# Patient Record
Sex: Male | Born: 1990 | Race: White | Hispanic: No | Marital: Single | Smoking: Current every day smoker
Health system: Southern US, Community
[De-identification: ages and names within clinical notes are randomized; demographics above are authoritative.]

## PROBLEM LIST (undated history)

## (undated) DIAGNOSIS — Z91010 Allergy to peanuts: Secondary | ICD-10-CM

---
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
## 2012-10-07 IMAGING — CR DG SHOULDER 1V*L*
2 series · 2 of 2 positions shown · non-contrast
Comparison: 11/07/2011 at [DATE] p.m.

CLINICAL DATA: Baseball injury of the shoulder.  Post reduction
views.

PORTABLE LEFT SHOULDER - 2+ VIEW

[view not recorded (1 of 2)]
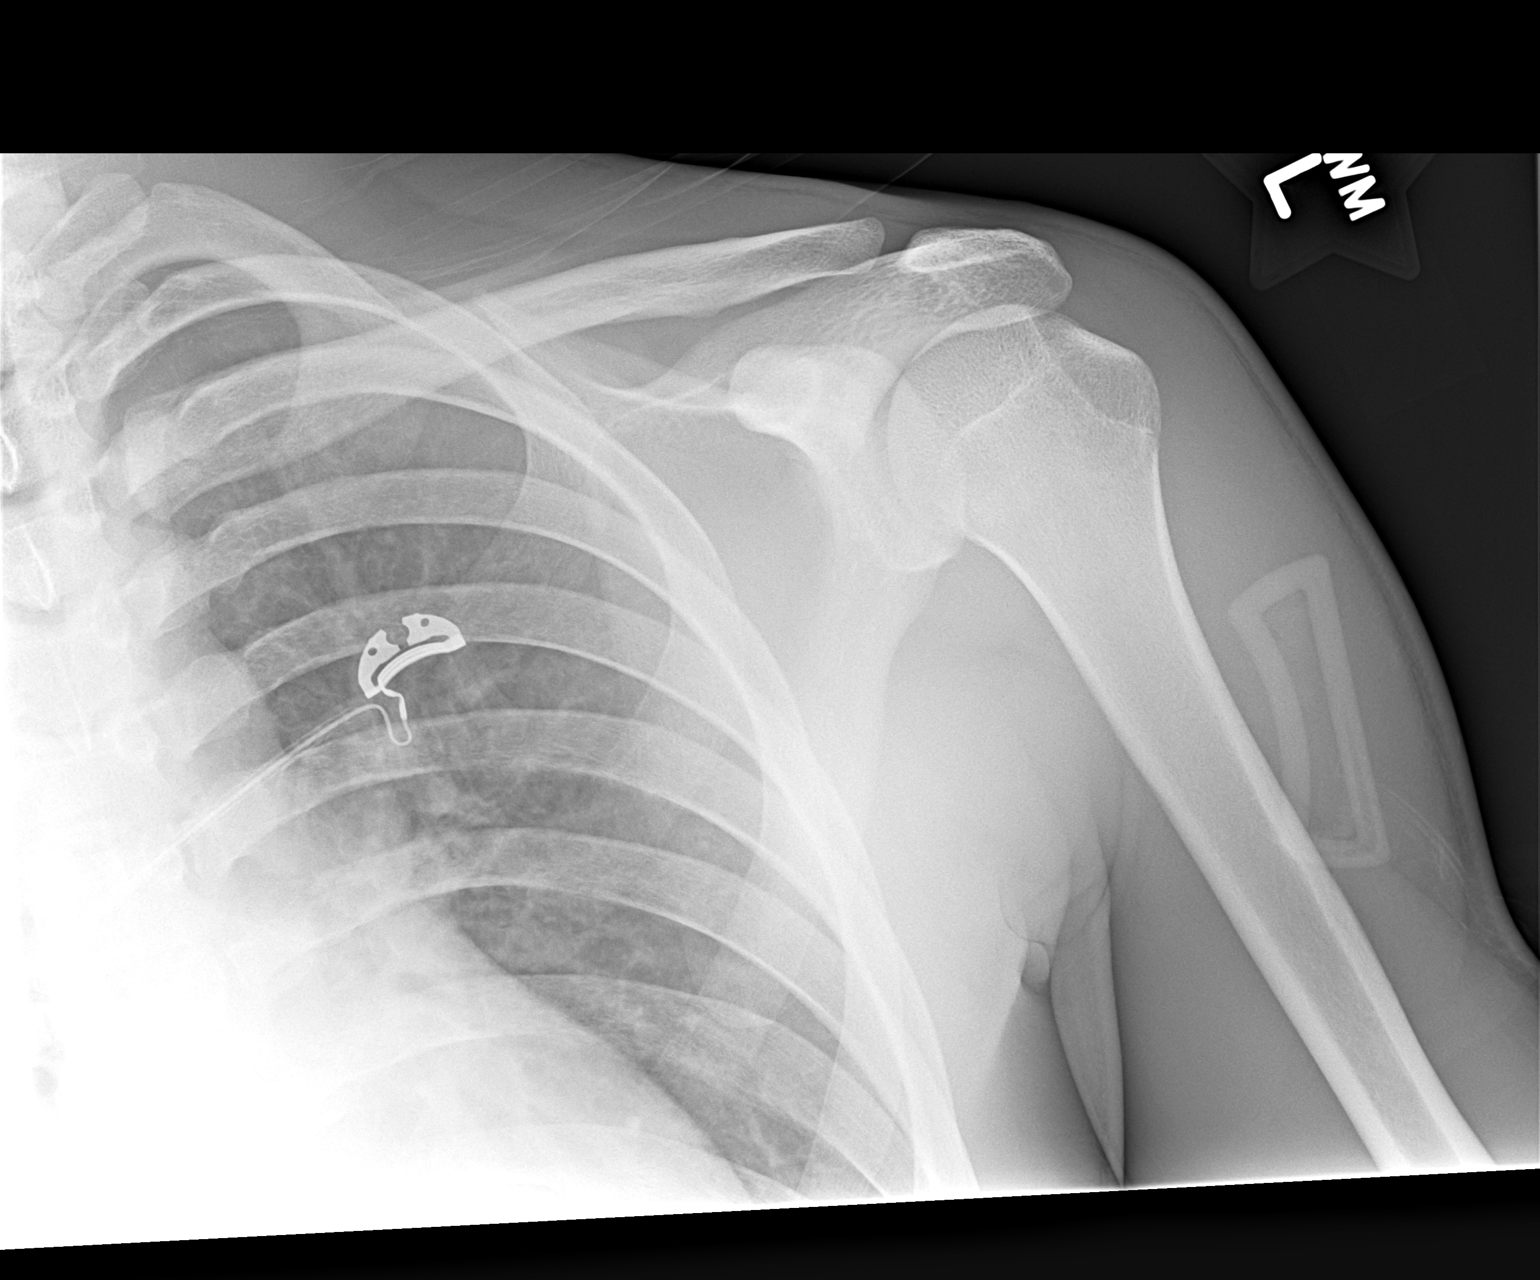

[view not recorded (2 of 2)]
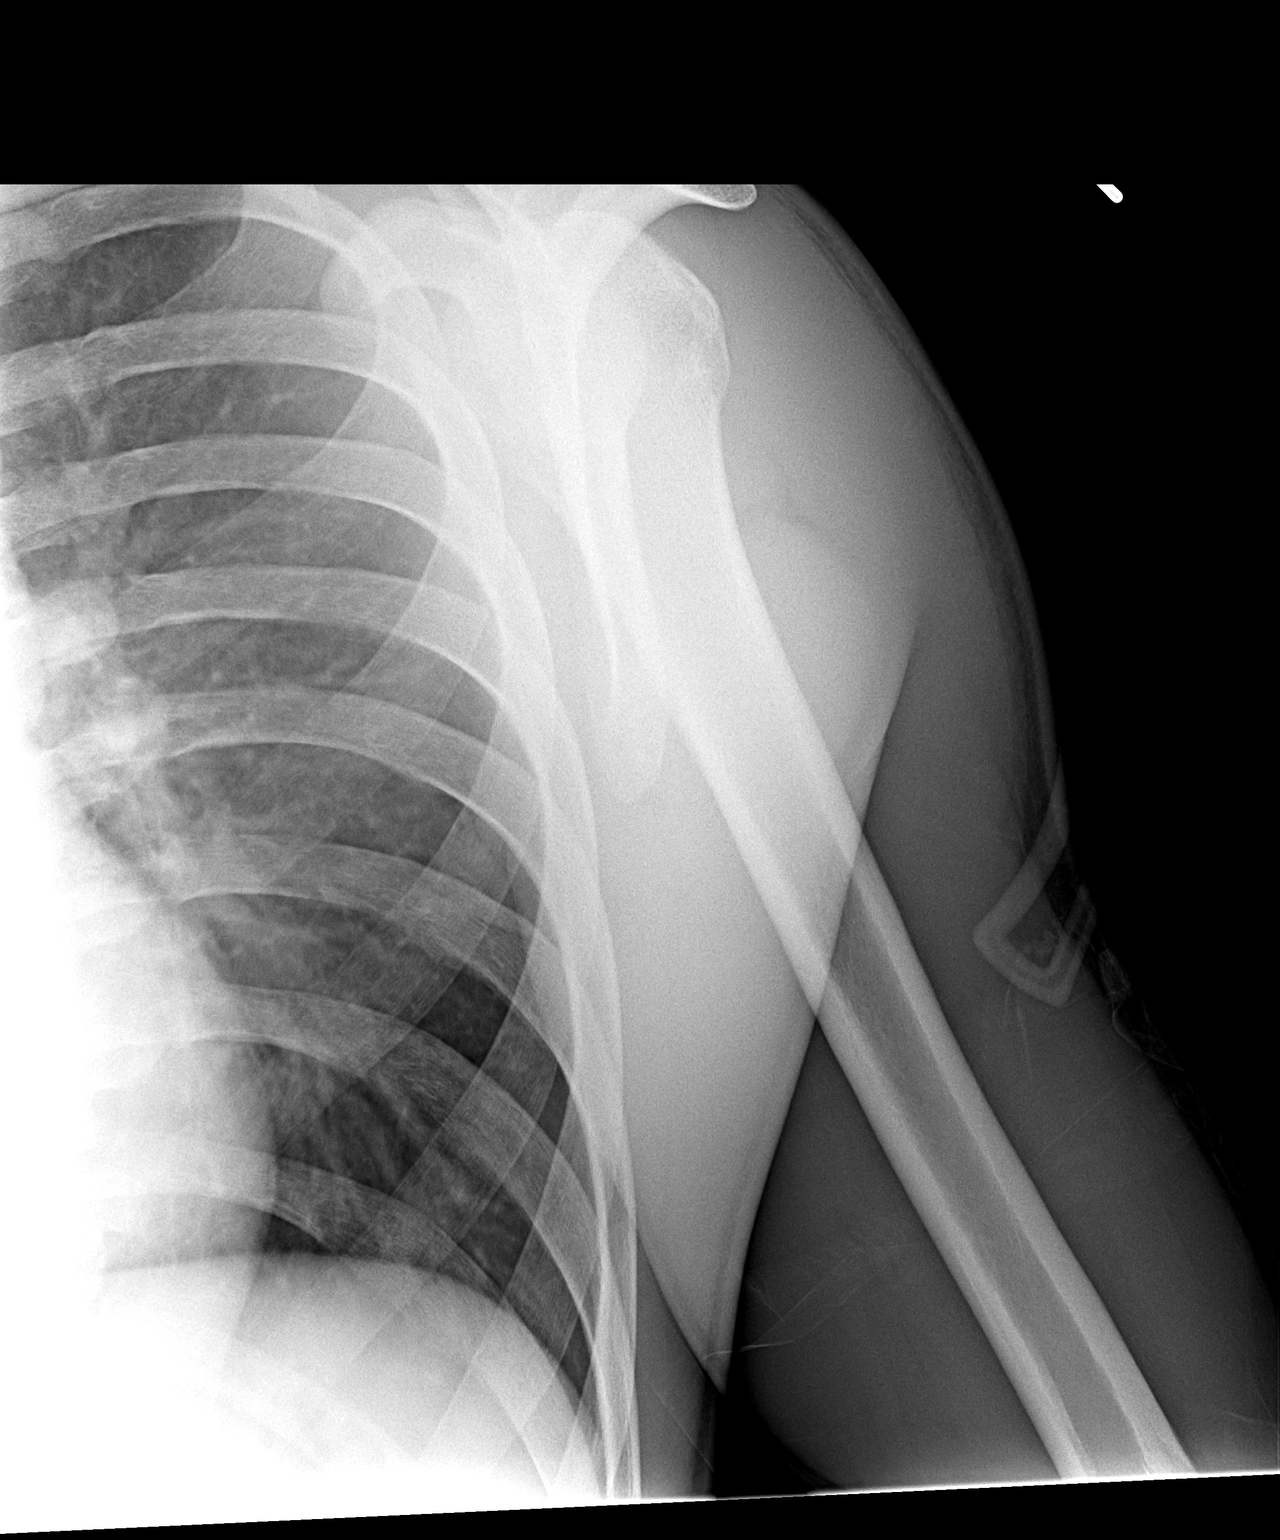

[2 of 2 positions shown; findings below may reference images not displayed]

FINDINGS: Successful reduction of the prior dislocation noted.  The
AC joint appears unremarkable.  No definite Hill-Sachs impaction or
bony Bankart injury.
IMPRESSION: 1.  Successful reduction, without visualized Hill-Sachs impaction
or bony Bankart injury.

## 2012-10-07 IMAGING — CR DG SHOULDER 2+V*L*
2 series · 2 of 2 positions shown · non-contrast
Comparison: None.

CLINICAL DATA: Shoulder injury

LEFT SHOULDER - 2+ VIEW

[w shoulder ap internal left]
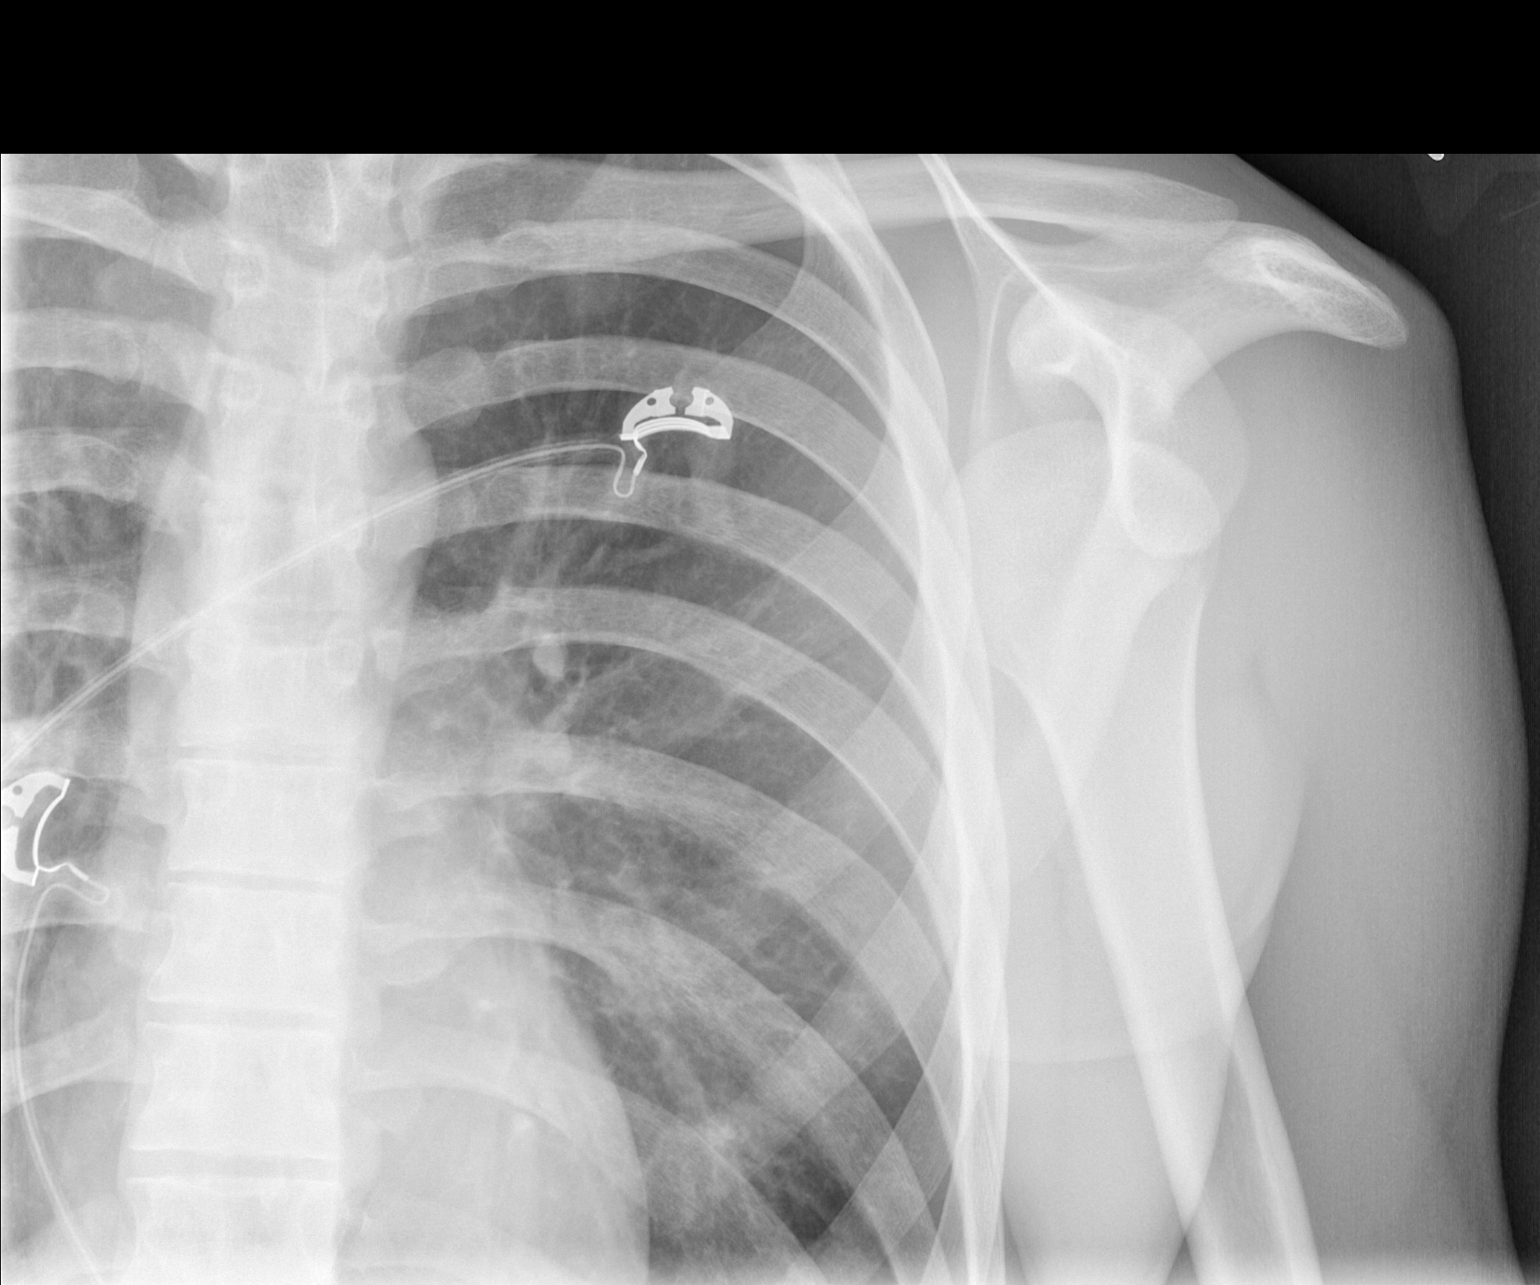

[w shoulder y view left]
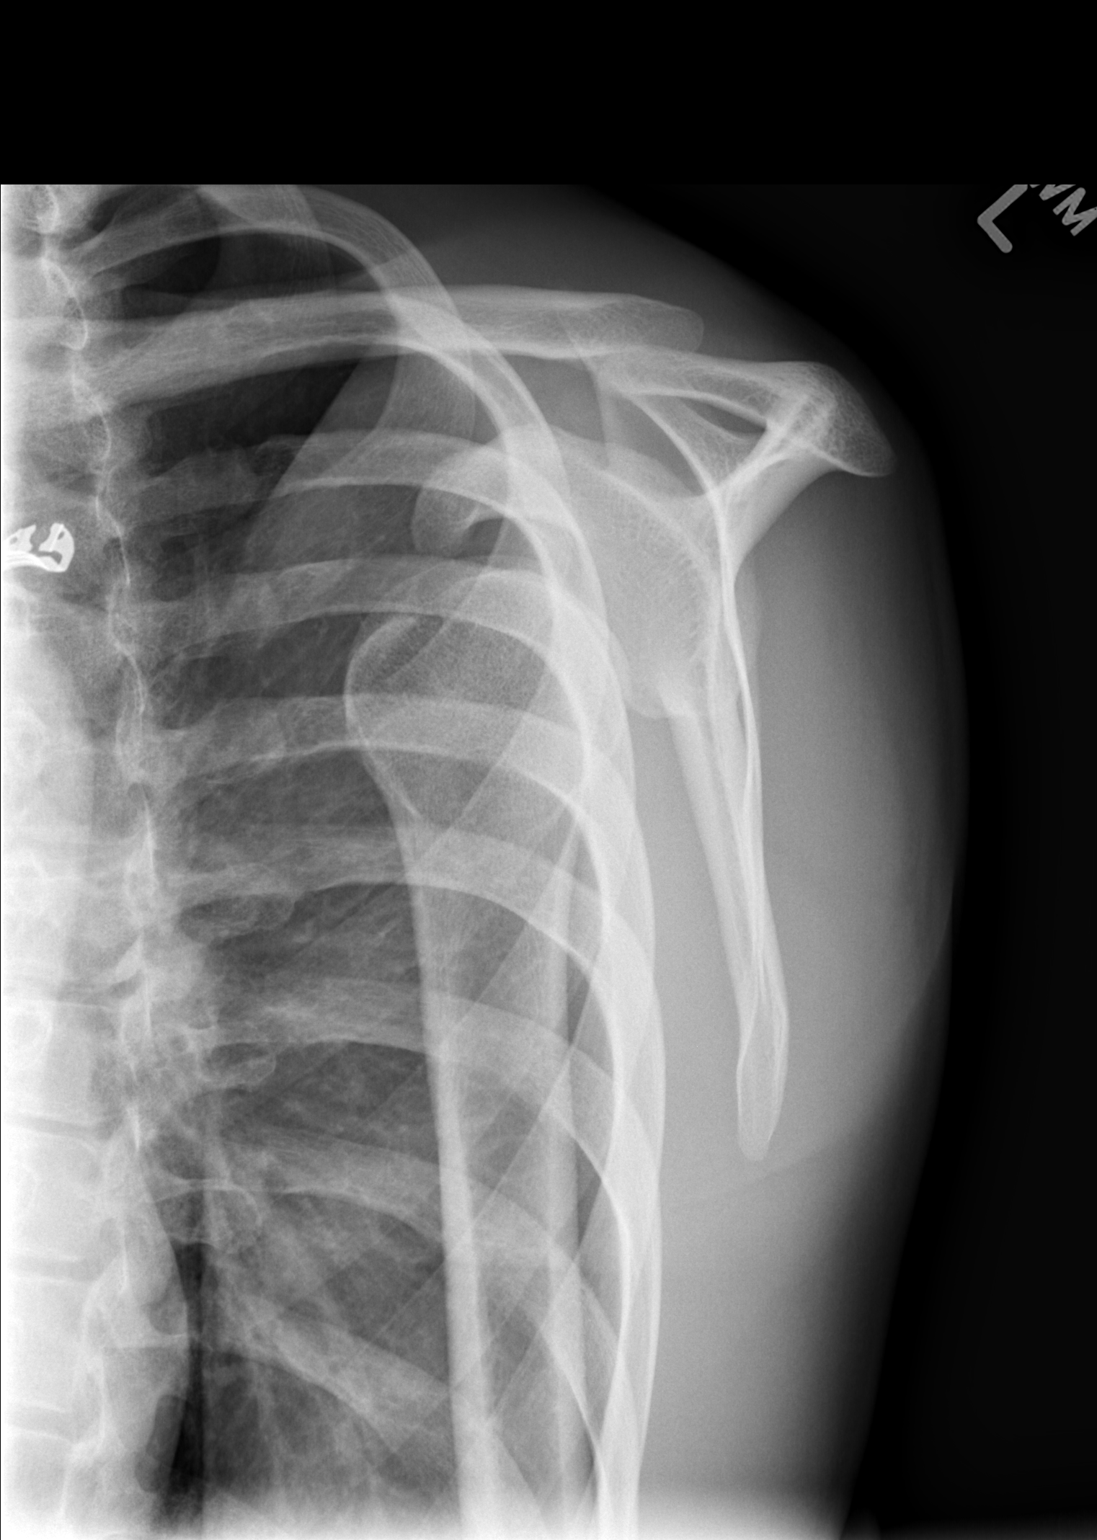

[2 of 2 positions shown; findings below may reference images not displayed]

FINDINGS: Anterior inferior shoulder dislocation.  No visible
fracture.
IMPRESSION: Left glenohumeral shoulder dislocation.  Postreduction films
recommended.

## 2019-04-14 ENCOUNTER — Other Ambulatory Visit: Payer: Self-pay

## 2019-04-14 ENCOUNTER — Emergency Department (INDEPENDENT_AMBULATORY_CARE_PROVIDER_SITE_OTHER)
Admission: EM | Admit: 2019-04-14 | Discharge: 2019-04-14 | Disposition: A | Discharge status: Home / Self Care | Payer: BC Managed Care – PPO | Source: Home / Self Care

## 2019-04-14 DIAGNOSIS — J019 Acute sinusitis, unspecified: Secondary | ICD-10-CM | POA: Diagnosis not present

## 2019-04-14 MED ORDER — PREDNISONE 50 MG PO TABS: 50.0000 mg | ORAL_TABLET | Freq: Every day | ORAL | 0 refills | Status: AC

## 2019-04-14 MED ORDER — IPRATROPIUM BROMIDE 0.06 % NA SOLN: 2.0000 | Freq: Four times a day (QID) | NASAL | 1 refills | Status: AC

## 2019-04-14 MED ORDER — AZITHROMYCIN 250 MG PO TABS: 250.0000 mg | ORAL_TABLET | Freq: Every day | ORAL | 0 refills | Status: AC

## 2019-04-14 NOTE — ED Provider Notes (Signed)
[facility]    CSN: [id number] Arrival date & time: 04/14/19  0903      History   Chief Complaint Chief Complaint  Patient presents with  . Nasal Congestion    HPI Erik Merritt is a 28 y.o. male.   HPI Erik Merritt is a 28 y.o. male presenting to UC with c/o 4 days of gradually worsening sinus congestion, pain and pressure, frontal HA and neck sore ness. He has taken Tylenol with minimal relief. Hx of sinus infections in the past. Denies fever, chills, n/v/d. No sick contacts or recent travel.   Past Medical History:  Diagnosis Date  . Asthma   . Peanut allergy     There are no active problems to display for this patient.   History reviewed. No pertinent surgical history.     Home Medications    Prior to Admission medications   Medication Sig Start Date End Date Taking? Authorizing Provider  albuterol (PROVENTIL HFA;VENTOLIN HFA) 108 (90 BASE) MCG/ACT inhaler Inhale 2 puffs into the lungs every 6 (six) hours as needed.      [provider]  albuterol (PROVENTIL) (2.5 MG/3ML) 0.083% nebulizer solution Take 3 mLs (2.5 mg total) by nebulization every 6 (six) hours as needed for wheezing. 05/26/11 05/25/12  Janeann Forehand, MD  azithromycin (ZITHROMAX) 250 MG tablet Take 1 tablet (250 mg total) by mouth daily. Take first 2 tablets together, then 1 every day until finished. 04/14/19   Noe Gens, PA-C  ipratropium (ATROVENT) 0.06 % nasal spray Place 2 sprays into both nostrils 4 (four) times daily. 04/14/19   Noe Gens, PA-C  predniSONE (DELTASONE) 50 MG tablet Take 1 tablet (50 mg total) by mouth daily with breakfast for 5 days. 04/14/19 04/19/19  Noe Gens, PA-C    Family History History reviewed. No pertinent family history.  Social History Social History   Tobacco Use  . Smoking status: Current Every Day Smoker    Packs/day: 0.50  Substance Use Topics  . Alcohol use: No  . Drug use: No     Allergies    Amoxicillin, Eggs or egg-derived products, and Peanut-containing drug products   Review of Systems Review of Systems  Constitutional: Negative for chills and fever.  HENT: Positive for congestion, postnasal drip, sinus pressure and sinus pain. Negative for ear pain, sore throat, trouble swallowing and voice change.   Respiratory: Positive for cough (minimal from post-nasal drainage). Negative for shortness of breath.   Cardiovascular: Negative for chest pain and palpitations.  Gastrointestinal: Negative for abdominal pain, diarrhea, nausea and vomiting.  Musculoskeletal: Negative for arthralgias, back pain and myalgias.  Skin: Negative for rash.  Neurological: Positive for headaches. Negative for dizziness and light-headedness.     Physical Exam Triage Vital Signs ED Triage Vitals  Enc Vitals Group     BP 04/14/19 0919 140/84     Pulse Rate 04/14/19 0919 84     Resp 04/14/19 0919 18     Temp 04/14/19 0919 98.3 F (36.8 C)     Temp Source 04/14/19 0919 Oral     SpO2 04/14/19 0919 97 %     Weight 04/14/19 0920 139 lb (63 kg)     Height 04/14/19 0920 5\' 8"  (1.727 m)     Head Circumference --      Peak Flow --      Pain Score 04/14/19 0920 0     Pain Loc --      Pain Edu? --  Excl. in GC? --    No data found.  Updated Vital Signs BP 140/84 (BP Location: Right Arm)   Pulse 84   Temp 98.3 F (36.8 C) (Oral)   Resp 18   Ht 5\' 8"  (1.727 m)   Wt 139 lb (63 kg)   SpO2 97%   BMI 21.13 kg/m     Physical Exam Vitals signs and nursing note reviewed.  Constitutional:      Appearance: Normal appearance. He is well-developed.  HENT:     Head: Normocephalic and atraumatic.     Right Ear: Tympanic membrane normal.     Left Ear: Tympanic membrane normal.     Nose: Mucosal edema present.     Right Sinus: No maxillary sinus tenderness or frontal sinus tenderness.     Left Sinus: No maxillary sinus tenderness or frontal sinus tenderness.     Mouth/Throat:     Lips: Pink.      Mouth: Mucous membranes are moist.     Pharynx: Oropharynx is clear. Uvula midline.  Neck:     Musculoskeletal: Normal range of motion.  Cardiovascular:     Rate and Rhythm: Normal rate and regular rhythm.  Pulmonary:     Effort: Pulmonary effort is normal. No respiratory distress.     Breath sounds: Normal breath sounds. No stridor. No wheezing, rhonchi or rales.  Musculoskeletal: Normal range of motion.  Skin:    General: Skin is warm and dry.  Neurological:     Mental Status: He is alert and oriented to person, place, and time.  Psychiatric:        Behavior: Behavior normal.      UC Treatments / Results  Labs (all labs ordered are listed, but only abnormal results are displayed) Labs Reviewed - No data to display  EKG   Radiology No results found.  Procedures Procedures (including critical care time)  Medications Ordered in UC Medications - No data to display  Initial Impression / Assessment and Plan / UC Course  I have reviewed the triage vital signs and the nursing notes.  Pertinent labs & imaging results that were available during my care of the patient were reviewed by me and considered in my medical decision making (see chart for details).    Hx and exam c/w sinus HA without evidence of bacterial infection at this time Encouraged symptomatic tx Prescription for Azithromycin with expiration date provided. AVS provided  Final Clinical Impressions(s) / UC Diagnoses   Final diagnoses:  Acute rhinosinusitis     Discharge Instructions      There is no evidence of bacterial infection today.  Begin prednisone burst. Take plain guaifenesin (1200mg  extended release tabs such as Mucinex) twice daily, with plenty of water, for cough and congestion.  Get adequate rest.     Also recommend using saline nasal spray several times daily and saline nasal irrigation (AYR is a common brand).  Use Ipratropium nasal spray each morning after saline nasal irrigation.  Try warm salt water gargles for sore throat.   Stop all antihistamines for now, and other non-prescription cough/cold preparations. May take Delsym Cough Suppressant at bedtime for nighttime cough.   Begin Azithromycin if not improving about one week or if persistent fever develops (Given a prescription to hold, with an expiration date)   Follow-up with family doctor if not improving about10 days.      ED Prescriptions    Medication Sig Dispense Auth. Provider   predniSONE (DELTASONE) 50 MG tablet Take  1 tablet (50 mg total) by mouth daily with breakfast for 5 days. 5 tablet Waylan RocherPhelps, Minnie Shi O, PA-C   ipratropium (ATROVENT) 0.06 % nasal spray Place 2 sprays into both nostrils 4 (four) times daily. 15 mL Doroteo GlassmanPhelps, Aolani Piggott O, PA-C   azithromycin (ZITHROMAX) 250 MG tablet Take 1 tablet (250 mg total) by mouth daily. Take first 2 tablets together, then 1 every day until finished. 6 tablet Lurene ShadowPhelps, Kateleen Encarnacion O, PA-C     PDMP not reviewed this encounter.   Lurene Shadowhelps, Alvin Diffee O, [place]PA-C 04/14/19 1148

## 2019-04-14 NOTE — ED Triage Notes (Signed)
Pt c/o sinus pain/pressure, x 4 days. Headache and neck pain. Tylenol prn.

## 2019-04-14 NOTE — Discharge Instructions (Signed)
°  There is no evidence of bacterial infection today.  Begin prednisone burst. Take plain guaifenesin (1200mg  extended release tabs such as Mucinex) twice daily, with plenty of water, for cough and congestion.  Get adequate rest.     Also recommend using saline nasal spray several times daily and saline nasal irrigation (AYR is a common brand).  Use Ipratropium nasal spray each morning after saline nasal irrigation. Try warm salt water gargles for sore throat.   Stop all antihistamines for now, and other non-prescription cough/cold preparations. May take Delsym Cough Suppressant at bedtime for nighttime cough.   Begin Azithromycin if not improving about one week or if persistent fever develops (Given a prescription to hold, with an expiration date)   Follow-up with family doctor if not improving about10 days.

## 2021-04-25 ENCOUNTER — Emergency Department
Admission: EM | Admit: 2021-04-25 | Discharge: 2021-04-25 | Discharge status: Absent without leave | Payer: BC Managed Care – PPO | Source: Home / Self Care

## 2021-04-25 ENCOUNTER — Other Ambulatory Visit: Payer: Self-pay

## 2021-04-25 ENCOUNTER — Ambulatory Visit (HOSPITAL_COMMUNITY)
Admission: EM | Admit: 2021-04-25 | Discharge: 2021-04-25 | Disposition: A | Discharge status: Home / Self Care | Payer: 59 | Attending: Family Medicine | Admitting: Family Medicine

## 2021-04-25 DIAGNOSIS — F15280 Other stimulant dependence with stimulant-induced anxiety disorder: Secondary | ICD-10-CM | POA: Diagnosis not present

## 2021-04-25 DIAGNOSIS — F410 Panic disorder [episodic paroxysmal anxiety] without agoraphobia: Secondary | ICD-10-CM

## 2021-04-25 MED ORDER — HYDROXYZINE HCL 25 MG PO TABS: 25.0000 mg | ORAL_TABLET | Freq: Three times a day (TID) | ORAL | 0 refills | Status: AC | PRN

## 2021-04-25 NOTE — Progress Notes (Signed)
Pt comes in with complaint of panic attacks over the last three days.  He is accompanied by his father.  Patient is not on any medications for anxiety and he has no outpatient providers. Pt has no SI, HI or A/V hallucinations.

## 2021-04-25 NOTE — Discharge Instructions (Addendum)
Contact one the following office to schedule an appointment for ongoing management of panic attacks as needed.  [facility] [place] [phone], [place], [place] [address]  [facility] [place], [address] [place], [place], [address] Phone [phone]

## 2021-04-25 NOTE — ED Provider Notes (Addendum)
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 male without prior mental health history, presents today with complaint of 3 days of recurrent panic attacks. Patient reports following completing his shift at work 3 days ago, he experienced a panic attack consisting of paresthesia,chest tightness, SOB. He denies any identifiable precipitate to cause this reaction. He was able to drive himself home, however, has experienced poor sleep and increased worry and anxiousness. Patient endorses a 2-3 year history of daily use of Kratom and vapes THC products. He used Kratom following the panic attack, which subsequently worsened anxiety symptoms.  He last used a lessor strength form of Kratom today which he reports as of this evening, feeling the most calm within the last 3 days. He denies use of other illicit substances or alcohol abuse. He denies any active thoughts of SI/HI/AH/VH. He endorses intermittent feelings of "gloominess" denies feelings of depression, hopelessness, or tearfulness. He is employed full-time. Last worked 3 days ago. Endorses poor appetite over the last 3 days however, was able to forces himself to eat ad tolerated food today which also made him feel better. Father is present with patient in the assessment room and endorses a family history of anxiety. Although patient denies any prior panic attacks or episodes of anxiety similar to what he's experienced over the last 3 days.  On evaluation patient is alert and oriented x 4, pleasant, and cooperative. Speech is clear and coherent. Mood is anxious and affect is congruent with mood. Thought process is coherent and thought content is logical. Denies auditory and  visual hallucinations. No indication that patient is responding to internal stimuli. No evidence of delusional thought content. Denies suicidal ideations. Denies homicidal ideations.  Psychiatric Specialty Exam  Presentation  General Appearance:Appropriate for Environment; Well Groomed  Eye Contact:Good  Speech:Clear and Coherent; Normal Rate  Speech Volume:Normal  Handedness:No data recorded  Mood and Affect  Mood:Anxious  Affect:Congruent   Thought Process  Thought Processes:Coherent; Goal Directed; Linear  Descriptions of Associations:Intact  Orientation:Full (Time, Place and Person)  Thought Content:WDL    Hallucinations:None  Ideas of Reference:None  Suicidal Thoughts:No  Homicidal Thoughts:No   Sensorium  Memory:Immediate Good; Recent Good; Remote Good  Judgment:Good  Insight:Fair   Executive Functions  Concentration:Good  Attention Span:Good  Recall:Good  Fund of Knowledge:Good  Language:Good   Psychomotor Activity  Psychomotor Activity:Restlessness   Assets  Assets:Communication Skills; Desire for Improvement; Financial Resources/Insurance; Housing; Physical Health; Social Support   Sleep  Sleep:Good  Number of hours: No data recorded  Nutritional Assessment (For [facility] and [facility] admissions only) Has the patient had a weight loss or gain of 10 pounds or more in the last 3 months?: No Has the patient had a decrease in food intake/or appetite?: Yes Does the patient have dental problems?: No Does the patient have eating habits or behaviors that may be indicators of an eating disorder including binging or inducing vomiting?: No Has the patient recently lost weight without trying?: 1 Has the patient been eating poorly because of a decreased appetite?: 1 Malnutrition Screening Tool Score: 2   Physical Exam: Physical Exam Vitals reviewed.  Constitutional:      Appearance: Normal appearance. He is normal weight. He is not ill-appearing.   HENT:  Head: Normocephalic and atraumatic.  Eyes:     Extraocular Movements: Extraocular movements intact.     Pupils: Pupils are equal, round, and reactive to light.  Cardiovascular:     Rate and Rhythm: Normal rate.  Pulmonary:     Effort: Pulmonary effort is normal.     Breath sounds: Normal breath sounds.  Musculoskeletal:        General: Normal range of motion.     Cervical back: Normal range of motion.  Skin:    General: Skin is warm and dry.  Neurological:     General: No focal deficit present.     Mental Status: He is alert and oriented to person, place, and time.  Psychiatric:        Attention and Perception: He is attentive.        Mood and Affect: Mood is anxious.        Speech: Speech normal.        Behavior: Behavior normal. Behavior is cooperative.        Thought Content: Thought content normal. Thought content is not paranoid or delusional. Thought content does not include homicidal or suicidal ideation. Thought content does not include homicidal or suicidal plan.        Cognition and Memory: Cognition and memory normal.        Judgment: Judgment normal.   Review of Systems  Constitutional:  Positive for weight loss.  HENT: Negative.    Eyes: Negative.   Respiratory: Negative.    Cardiovascular: Negative.   Gastrointestinal: Negative.   Musculoskeletal: Negative.   Skin: Negative.   Neurological: Negative.   Psychiatric/Behavioral:  Positive for substance abuse. Negative for hallucinations, memory loss and suicidal ideas. The patient is nervous/anxious and has insomnia.        Kratom, CBD-products, THC-products    Blood pressure 119/80, pulse 82, temperature 99 F (37.2 C), temperature source Tympanic, resp. rate 18, SpO2 100 %. There is no height or weight on file to calculate BMI.  Musculoskeletal: Strength & Muscle Tone: within normal limits Gait & Station: normal Patient leans: N/A   [facility] MSE Discharge Disposition for Follow up and  Recommendations: Based on my evaluation the patient does not appear to have an emergency medical condition and can be discharged with resources and follow up care in outpatient services for Medication Management-Will trial Hydroxyzine 25 mg PRN TID. Medication instructions and SE discussed. Individual Therapy- resources provided outpatient management of therapy and medication. Anticipatory Guidance provided to discontinue use of Kratom and CBD substances as this is likely contributing to panic attacks.  Return here to [facility] as needed.   Jackelyn Poling, NP 04/25/2021, 10:33 PM

## 2021-04-26 ENCOUNTER — Telehealth (HOSPITAL_COMMUNITY): Payer: Self-pay

## 2021-04-26 NOTE — BH Assessment (Signed)
Care Management - Follow Up [facility] Discharges   Writer attempted to make contact with patient today and was unsuccessful.  Writer left a HIPPA compliant voice message.   Per chart review, patient was provided with outpatient resources.  

## 2021-06-03 ENCOUNTER — Emergency Department (INDEPENDENT_AMBULATORY_CARE_PROVIDER_SITE_OTHER)
Admission: EM | Admit: 2021-06-03 | Discharge: 2021-06-03 | Disposition: A | Discharge status: Home / Self Care | Payer: Managed Care, Other (non HMO) | Source: Home / Self Care | Attending: Family Medicine | Admitting: Family Medicine

## 2021-06-03 ENCOUNTER — Other Ambulatory Visit: Payer: Self-pay

## 2021-06-03 DIAGNOSIS — R6889 Other general symptoms and signs: Secondary | ICD-10-CM

## 2021-06-03 DIAGNOSIS — J029 Acute pharyngitis, unspecified: Secondary | ICD-10-CM | POA: Diagnosis not present

## 2021-06-03 DIAGNOSIS — R000 Tachycardia, unspecified: Secondary | ICD-10-CM

## 2021-06-03 LAB — POCT INFLUENZA A/B
Influenza A, POC: NEGATIVE
Influenza B, POC: NEGATIVE

## 2021-06-03 LAB — POC SARS CORONAVIRUS 2 AG -  ED: SARS Coronavirus 2 Ag: NEGATIVE

## 2021-06-03 LAB — POCT RAPID STREP A (OFFICE): Rapid Strep A Screen: NEGATIVE

## 2021-06-03 MED ORDER — ACETAMINOPHEN 325 MG PO TABS
650.0000 mg | ORAL_TABLET | Freq: Once | ORAL | Status: AC
  Administered 2021-06-03: 650 mg via ORAL

## 2021-06-03 NOTE — Discharge Instructions (Addendum)
Strep test is negative Flu test is negative COVID test is negative This means you have a respiratory virus.  Rest, push fluids, and take over-the-counter medications See your doctor if not improving by next week You may return to work when your symptoms have improved and your fever is gone

## 2021-06-03 NOTE — ED Provider Notes (Signed)
[facility]    CSN: [id number] Arrival date & time: 06/03/21  1144      History   Chief Complaint Chief Complaint  Patient presents with   Sore Throat   Generalized Body Aches    HPI Erik Merritt is a 30 y.o. male.   HPI  30 year old gentleman with anxiety issues is here for tachycardia.  He states that he is alarmed that his heart rate has been in the 130 range today.  He also has fever, sore throat, headache, and body aches.  Unknown exposure to illness.  Patient not COVID or flu vaccinated.  Patient has a history of tachycardia, illicit drug use, anxiety  Past Medical History:  Diagnosis Date   Asthma    Peanut allergy     There are no problems to display for this patient.   History reviewed. No pertinent surgical history.     Home Medications    Prior to Admission medications   Medication Sig Start Date End Date Taking? Authorizing Provider  ALPRAZolam Prudy Feeler) 0.25 MG tablet Take by mouth. 05/10/21 05/10/22 Yes [provider]  sertraline (ZOLOFT) 50 MG tablet 1/2 tablet daily for one week then 1 tablet daily 05/10/21 06/24/21 Yes [provider]  albuterol (PROVENTIL HFA;VENTOLIN HFA) 108 (90 BASE) MCG/ACT inhaler Inhale 2 puffs into the lungs every 6 (six) hours as needed.      [provider]  albuterol (PROVENTIL) (2.5 MG/3ML) 0.083% nebulizer solution Take 3 mLs (2.5 mg total) by nebulization every 6 (six) hours as needed for wheezing. 05/26/11 05/25/12  Marlaine Hind, MD  ALPRAZolam Prudy Feeler) 0.25 MG tablet Take 0.25 mg by mouth 3 (three) times daily as needed. 05/10/21   [provider]  azithromycin (ZITHROMAX) 250 MG tablet Take 1 tablet (250 mg total) by mouth daily. Take first 2 tablets together, then 1 every day until finished. 04/14/19   Lurene Shadow, PA-C  hydrOXYzine (ATARAX/VISTARIL) 25 MG tablet Take 1 tablet (25 mg total) by mouth 3 (three) times daily as needed for anxiety. 04/25/21    Bing Neighbors, FNP  ipratropium (ATROVENT) 0.06 % nasal spray Place 2 sprays into both nostrils 4 (four) times daily. 04/14/19   Lurene Shadow, PA-C  sertraline (ZOLOFT) 50 MG tablet Take by mouth. 05/10/21   [provider]    Family History History reviewed. No pertinent family history.  Social History Social History   Tobacco Use   Smoking status: Every Day    Packs/day: 0.50    Types: Cigarettes  Vaping Use   Vaping Use: Every day   Devices: 2 years  Substance Use Topics   Alcohol use: No   Drug use: No     Allergies   Amoxicillin, Eggs or egg-derived products, and Peanut-containing drug products   Review of Systems Review of Systems See HPI  Physical Exam Triage Vital Signs ED Triage Vitals  Enc Vitals Group     BP 06/03/21 1147 121/74     Pulse Rate 06/03/21 1147 (!) 143     Resp 06/03/21 1147 20     Temp 06/03/21 1147 99.3 F (37.4 C)     Temp Source 06/03/21 1147 Oral     SpO2 06/03/21 1147 99 %     Weight --      Height --      Head Circumference --      Peak Flow --      Pain Score 06/03/21 1151 10  Pain Loc --      Pain Edu? --      Excl. in GC? --    No data found.  Updated Vital Signs BP 121/74 (BP Location: Left Arm)   Pulse (!) 131   Temp 99.9 F (37.7 C) (Tympanic)   Resp 20   SpO2 99%     Physical Exam Constitutional:      General: He is not in acute distress.    Appearance: He is well-developed. He is ill-appearing.     Comments: anxious  HENT:     Head: Normocephalic and atraumatic.     Right Ear: Tympanic membrane and ear canal normal.     Left Ear: Tympanic membrane normal.     Mouth/Throat:     Pharynx: Posterior oropharyngeal erythema present.     Tonsils: No tonsillar exudate. 1+ on the right. 1+ on the left.  Eyes:     Conjunctiva/sclera: Conjunctivae normal.     Pupils: Pupils are equal, round, and reactive to light.  Cardiovascular:     Rate and Rhythm: Tachycardia present.     Heart sounds:  Normal heart sounds.  Pulmonary:     Effort: Pulmonary effort is normal. No respiratory distress.     Breath sounds: Normal breath sounds.  Abdominal:     General: There is no distension.     Palpations: Abdomen is soft.  Musculoskeletal:        General: Normal range of motion.     Cervical back: Normal range of motion.  Lymphadenopathy:     Cervical: Cervical adenopathy present.  Skin:    General: Skin is warm and dry.  Neurological:     Mental Status: He is alert.     UC Treatments / Results  Labs (all labs ordered are listed, but only abnormal results are displayed) Labs Reviewed  POCT INFLUENZA A/B  POCT RAPID STREP A (OFFICE)  POC SARS CORONAVIRUS 2 AG -  ED    EKG   Radiology No results found.  Procedures Procedures (including critical care time)  Medications Ordered in UC Medications  acetaminophen (TYLENOL) tablet 650 mg (650 mg Oral Given 06/03/21 1155)    Initial Impression / Assessment and Plan / UC Course  I have reviewed the triage vital signs and the nursing notes.  Pertinent labs & imaging results that were available during my care of the patient were reviewed by me and considered in my medical decision making (see chart for details).     Final Clinical Impressions(s) / UC Diagnoses   Final diagnoses:  Sore throat  Flu-like symptoms  Tachycardia     Discharge Instructions      Strep test is negative Flu test is negative COVID test is negative This means you have a respiratory virus.  Rest, push fluids, and take over-the-counter medications See your doctor if not improving by next week You may return to work when your symptoms have improved and your fever is gone     ED Prescriptions   None    PDMP not reviewed this encounter.   Eustace Moore, MD 06/03/21 [phone]

## 2021-06-03 NOTE — ED Triage Notes (Signed)
Pt present sore throat with body aches symptoms started last night.
